# Patient Record
Sex: Male | Born: 2000 | Race: White | Hispanic: No | Marital: Single | Smoking: Never smoker
Health system: Southern US, Community
[De-identification: ages and names within clinical notes are randomized; demographics above are authoritative.]

## PROBLEM LIST (undated history)

## (undated) DIAGNOSIS — F329 Major depressive disorder, single episode, unspecified: Secondary | ICD-10-CM

## (undated) DIAGNOSIS — F32A Depression, unspecified: Secondary | ICD-10-CM

## (undated) DIAGNOSIS — F419 Anxiety disorder, unspecified: Secondary | ICD-10-CM

## (undated) DIAGNOSIS — F938 Other childhood emotional disorders: Secondary | ICD-10-CM

## (undated) DIAGNOSIS — R51 Headache: Secondary | ICD-10-CM

## (undated) DIAGNOSIS — F339 Major depressive disorder, recurrent, unspecified: Secondary | ICD-10-CM

## (undated) DIAGNOSIS — R519 Headache, unspecified: Secondary | ICD-10-CM

## (undated) HISTORY — PX: MOUTH SURGERY: SHX715

---
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
## 2018-01-21 ENCOUNTER — Telehealth: Payer: Self-pay | Admitting: Neurology

## 2018-01-21 NOTE — Telephone Encounter (Signed)
Pts mother call scheduling a f/u per PCP. Stating PCP thinks the pt is experiencing absent seizures. Scheduled an appt for August pts mother advised that RN will call to get more detail also to make sure appt is something Dr. Frances Furbish feels she can handle. Dale Ferndale would also like a sooner appt if possible, please call to advise

## 2018-01-21 NOTE — Telephone Encounter (Signed)
I called pt's mother back, no answer, left a message asking her to call me back.  I called the home number listed, spoke to the pt. He reports that his mother called today to make him an appt with Dr. Anne Hahn for seizure-like activity. He is agreeable to an appt on 01/24/18 at 7:30am, check in 7:00am with Dr. Anne Hahn. Pt reports that he will pass this information on to his mother. Pt verbalized understanding of this appt date and time.

## 2018-01-24 ENCOUNTER — Encounter: Payer: Self-pay | Admitting: Neurology

## 2018-01-24 ENCOUNTER — Telehealth: Payer: Self-pay

## 2018-01-24 ENCOUNTER — Ambulatory Visit: Payer: Medicaid Other | Admitting: Neurology

## 2018-01-24 VITALS — BP 134/96 | HR 86 | Ht 69.0 in | Wt 200.0 lb

## 2018-01-24 DIAGNOSIS — G4733 Obstructive sleep apnea (adult) (pediatric): Secondary | ICD-10-CM

## 2018-01-24 NOTE — Telephone Encounter (Signed)
I called pt. He is agreeable to completing a cpap titration study. I explained that our sleep lab will work with his insurance to get this study approved and then call pt to schedule the study. Pt verbalized understanding.

## 2018-01-24 NOTE — Progress Notes (Signed)
Reason for visit: Hypersomnolence  Thomas Acevedo is an 17 y.o. male  History of present illness:  Thomas Acevedo is an 17 year old right-handed white male with a history of depression.  The patient is now followed through psychiatry in [place], [place].  The patient is on antidepressant medications and he takes Lamictal.  The patient is now cycling more frequently with periods of hypersomnolence, this is occurring every other week.  The patient once again denies hallucinations, hypersexual behavior, he sometimes feels as if he is running a fever but he does not have low body temperature.  He does not have hyperphagia, he oftentimes will eat less during these timeframes.  The patient has undergone a sleep study that revealed evidence of a moderate level of sleep apnea.  He has been seen through ENT, they did not believe that he needed a tonsillectomy.  The patient comes back to this office for further evaluation.  The episodes of hypersomnolence are interfering with his ability to function.  Past Medical History:  Diagnosis Date  . Anxiety   . 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 Depression   . Headache   . MDD (major depressive disorder), recurrent episode (HCC) 04/10/2015    Past Surgical History:  Procedure Laterality Date  . MOUTH SURGERY      No family history on file.  Social history:  reports that he has never smoked. He has never used smokeless tobacco. He reports that he does not drink alcohol or use drugs.   No Known Allergies  Medications:  Prior to Admission medications   Not on File    ROS:  Out of a complete 14 system review of symptoms, the patient complains only of the following symptoms, and all other reviewed systems are negative.  Decreased activity, decreased appetite, fatigue Double vision, blurred vision Heat intolerance Daytime sleepiness Memory loss, dizziness, speech difficulty, weakness Confusion, depression,  anxiety  There were no vitals taken for this visit.  Physical Exam  General: The patient is alert and cooperative at the time of the examination.  Skin: No significant peripheral edema is noted.   Neurologic Exam  Mental status: The patient is alert and oriented x 3 at the time of the examination. The patient has apparent normal recent and remote memory, with an apparently normal attention span and concentration ability.   Cranial nerves: Facial symmetry is present. Speech is normal, no aphasia or dysarthria is noted. Extraocular movements are full. Visual fields are full.  Motor: The patient has good strength in all 4 extremities.  Sensory examination: Soft touch sensation is symmetric on the face, arms, and legs.  Coordination: The patient has good finger-nose-finger and heel-to-shin bilaterally.  Gait and station: The patient has a normal gait. Tandem gait is normal. Romberg is negative. No drift is seen.  Reflexes: Deep tendon reflexes are symmetric.   MRI brain 07/19/17:  IMPRESSION: This is a normal MRI of the brain with and without contrast. 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 If the patient also has snoring, consider obstructive sleep apnea as a source of the fatigue and headache.  * MRI scan images were reviewed online. I agree with the written report.    Assessment/Plan:  1.  Cyclical hypersomnolence  2.  Depression  3.  Sleep apnea, untreated  The patient has had an EEG study that was normal, he has had MRI of the brain that was normal.  He does not fulfill the criteria for Kleine-Levin  syndrome.  The patient at this point needs the CPAP trial to determine what settings are appropriate for him.  I suspect that the underlying depression remains an issue.  The family is concerned that he is having absent seizures, but I do not think that this is the case.  He will follow-up here in 6 months.  [name]. Keith Theia Dezeeuw MD 01/24/2018 7:30 AM  [facility] [address] [place], [place] [id number]  Phone [phone] Fax [phone]

## 2018-01-24 NOTE — Telephone Encounter (Signed)
-----   Message from Huston FoleySaima Athar, MD sent at 01/24/2018  8:13 AM EDT ----- Baxter HireKristen,  Please see below. Can you call him and see if wants to schedule sleep study with CPAP?  sa ----- Message ----- From: York SpanielWillis, Charles K, MD Sent: 01/24/2018   7:59 AM To: Huston FoleySaima Athar, MD  This patient was seen today, he has been seen by ENT and they did not believe that he needed a tonsillectomy, by your study it appears that he has moderate sleep apnea, I think that they are ready to pursue a CPAP trial.

## 2018-01-27 ENCOUNTER — Ambulatory Visit (INDEPENDENT_AMBULATORY_CARE_PROVIDER_SITE_OTHER): Payer: Medicaid Other | Admitting: Neurology

## 2018-01-27 DIAGNOSIS — G4710 Hypersomnia, unspecified: Secondary | ICD-10-CM

## 2018-01-27 DIAGNOSIS — G4733 Obstructive sleep apnea (adult) (pediatric): Secondary | ICD-10-CM | POA: Diagnosis not present

## 2018-01-27 DIAGNOSIS — G4730 Sleep apnea, unspecified: Secondary | ICD-10-CM

## 2018-01-28 NOTE — Procedures (Signed)
PATIENT'S NAME:  Thomas Acevedo, Thomas Acevedo DOB:      04/16/2001      MR#:    [id number]     DATE OF RECORDING: 01/27/2018 REFERRING M.D.:  Dr. Lesia SagoKeith Willis Study Performed:   CPAP  Titration HISTORY:  17 year-old male with a history of anxiety, depression, recurrent headaches, and overweight state, who returns for a CPAP titration study. His baseline PSG on 09/29/17 showed moderate obstructive sleep apnea, with a total AHI of 20.3/hour, REM AHI of 3.7/hour, supine AHI of 14.8/hour and O2 nadir of 87%. The patient endorsed the Epworth Sleepiness Scale at 10/24 points, BMI of 28.7 kg/m2. The patient's neck circumference measured 16 inches.  CURRENT MEDICATIONS: None listed.  PROCEDURE:  This is a multichannel digital polysomnogram utilizing the SomnoStar 11.2 system.  Electrodes and sensors were applied and monitored per AASM Specifications.   EEG, EOG, Chin and Limb EMG, were sampled at 200 Hz.  ECG, Snore and Nasal Pressure, Thermal Airflow, Respiratory Effort, CPAP Flow and Pressure, Oximetry was sampled at 50 Hz. Digital video and audio were recorded.      The patient was fitted with small P30i nasal pillows. CPAP was initiated at 5 cmH20 with heated humidity per AASM split night standards and pressure was advanced to 12 cmH20 because of hypopneas, apneas and desaturations.  At a PAP pressure of 11 cmH20, there was a reduction of the AHI to 0 with supine REM sleep achieved and O2 nadir of 93%.  Lights Out was at 22:50 and Lights On at 05:17. Total recording time (TRT) was 387 minutes, with a total sleep time (TST) of 337 minutes. The patient's sleep latency was 41.5 minutes, which is delayed. REM latency was 83 minutes, which is normal. The sleep efficiency was 87.1 %.    SLEEP ARCHITECTURE: WASO (Wake after sleep onset) was 12.5 minutes.  There were 9 minutes in Stage N1, 154.5 minutes Stage N2, 122.5 minutes Stage N3 and 51 minutes in Stage REM.  The percentage of Stage N1 was 2.7%, Stage N2 was 45.8%, which is  normal, Stage N3 was 36.4%, which is increased, and Stage R (REM sleep) was 15.1%, which is mildly reduced. The arousals were noted as: 30 were spontaneous, 0 were associated with PLMs, 5 were associated with respiratory events.  RESPIRATORY ANALYSIS:  There was a total of 27 respiratory events: 0 obstructive apneas, 16 central apneas and 0 mixed apneas with a total of 16 apneas and an apnea index (AI) of 2.8 /hour. There were 11 hypopneas with a hypopnea index of 2./hour. The patient also had 0 respiratory event related arousals (RERAs).      The total APNEA/HYPOPNEA INDEX  (AHI) was 4.8/hour and the total RESPIRATORY DISTURBANCE INDEX was 4.8 0./hour  7 events occurred in REM sleep and 20 events in NREM. The REM AHI was 8.2 /hour versus a non-REM AHI of 4.2 0./hour.  The patient spent 219 minutes of total sleep time in the supine position and 118 minutes in non-supine. The supine AHI was 4.7, versus a non-supine AHI of 5.1.  OXYGEN SATURATION & C02:  The baseline 02 saturation was 96%, with the lowest being 89%. Time spent below 89% saturation equaled 0 minutes.  PERIODIC LIMB MOVEMENTS: The patient had a total of 0 Periodic Limb Movements. The Periodic Limb Movement (PLM) index was 0 and the PLM Arousal index was 0 /hour.  Audio and video analysis did not show any abnormal or unusual movements, behaviors, phonations or vocalizations. The patient  took no bathroom breaks. The EKG was in keeping with normal sinus rhythm (NSR).  Post-study, the patient indicated that sleep was the same as usual.   IMPRESSION:   1. Obstructive Sleep Apnea (OSA)   RECOMMENDATIONS:   1. This study demonstrates resolution of the patient's obstructive sleep apnea with CPAP therapy. I will, therefore, start the patient on home CPAP treatment at a pressure of 11 cm via small nasal pillows with heated humidity. The patient should be reminded to be fully compliant with PAP therapy to improve sleep related symptoms and  decrease long term cardiovascular risks. The patient should be reminded, that it may take up to 3 months to get fully used to using PAP with all planned sleep. The earlier full compliance is achieved, the better long term compliance tends to be. Please note that untreated obstructive sleep apnea may carry additional perioperative morbidity. Patients with significant obstructive sleep apnea should receive perioperative PAP therapy and the surgeons and particularly the anesthesiologist should be informed of the diagnosis and the severity of the sleep disordered breathing. 2. The patient should be cautioned not to drive, work at heights, or operate dangerous or heavy equipment when tired or sleepy. Review and reiteration of good sleep hygiene measures should be pursued with any patient. 3. The patient will be seen in follow-up by Dr. Frances Furbish at [facility] for discussion of the test results and further management strategies. The referring provider will be notified of the test results.   I certify that I have reviewed the entire raw data recording prior to the issuance of this report in accordance with the Standards of Accreditation of the American Academy of Sleep Medicine (AASM)     Huston Foley, MD, PhD Diplomat, American Board of Psychiatry and Neurology (Neurology and Sleep Medicine)

## 2018-01-28 NOTE — Addendum Note (Signed)
Addended by: Huston FoleyATHAR, Orvil Faraone on: 01/28/2018 11:15 AM   Modules accepted: Orders

## 2018-01-28 NOTE — Progress Notes (Signed)
Patient referred by Dr. Anne HahnWillis, seen by me on 08/11/17, diagnostic PSG on 09/29/17 (was supposed to have MSLT next day, but this was canceled d/t evidence of OSA). Patient had a CPAP titration study on 01/27/18.  Please call and inform patient that I have entered an order for treatment with positive airway pressure (PAP) treatment for obstructive sleep apnea (OSA). He did well during the latest sleep study with CPAP. We will, therefore, arrange for a machine for home use through a DME (durable medical equipment) company of His choice; and I will see the patient back in follow-up in about 10 weeks. Please also explain to the patient that I will be looking out for compliance data, which can be downloaded from the machine (stored on an SD card, that is inserted in the machine) or via remote access through a modem, that is built into the machine. At the time of the followup appointment we will discuss sleep study results and how it is going with PAP treatment at home. Please advise patient to bring His machine at the time of the first FU visit, even though this is cumbersome. Bringing the machine for every visit after that will likely not be needed, but often helps for the first visit to troubleshoot if needed. Please re-enforce the importance of compliance with treatment and the need for us to monitor compliance data - often an insurance requirement and actually good feedback for the patient as far as how they are doing.  Also remind patient, that any interim PAP machine or mask issues should be first addressed with the DME company, as they can often help better with technical and mask fit issues. Please ask if patient has a preference regarding DME company.  Please also make sure, the patient has a follow-up appointment with me in about 10 weeks from the setup date, thanks. May see one of our nurse practitioners if needed for proper timing of the FU appointment.  Please fax or rout report to the referring provider.  Thanks,   Huston FoleySaima Arihaan Bellucci, MD, PhD [facility]

## 2018-02-01 ENCOUNTER — Telehealth: Payer: Self-pay

## 2018-02-01 NOTE — Telephone Encounter (Signed)
I called pt. I advised pt that Dr. Frances FurbishAthar reviewed their sleep study results and found that pt did well with the cpap during his latest sleep study. Dr. Frances FurbishAthar recommends that pt start a cpap at home. I reviewed PAP compliance expectations with the pt. Pt is agreeable to starting a CPAP. I advised pt that an order will be sent to a DME, [employer], and [employer] will call the pt within about one week after they file with the pt's insurance. [employer] will show the pt how to use the machine, fit for masks, and troubleshoot the CPAP if needed. A follow up appt was made for insurance purposes with Dr. Frances FurbishAthar on 05/19/18 at 3:00pm. Pt verbalized understanding to arrive 15 minutes early and bring their CPAP. A letter with all of this information in it will be mailed to the pt as a reminder. I verified with the pt that the address we have on file is correct. Pt verbalized understanding of results. Pt had no questions at this time but was encouraged to call back if questions arise.

## 2018-02-01 NOTE — Telephone Encounter (Signed)
-----   Message from Thomas FoleySaima Athar, MD sent at 01/28/2018 11:14 AM EDT ----- Patient referred by Dr. Anne HahnWillis, seen by me on 08/11/17, diagnostic PSG on 09/29/17 (was supposed to have MSLT next day, but this was canceled d/t evidence of OSA). Patient had a CPAP titration study on 01/27/18.  Please call and inform patient that I have entered an order for treatment with positive airway pressure (PAP) treatment for obstructive sleep apnea (OSA). He did well during the latest sleep study with CPAP. We will, therefore, arrange for a machine for home use through a DME (durable medical equipment) company of His choice; and I will see the patient back in follow-up in about 10 weeks. Please also explain to the patient that I will be looking out for compliance data, which can be downloaded from the machine (stored on an SD card, that is inserted in the machine) or via remote access through a modem, that is built into the machine. At the time of the followup appointment we will discuss sleep study results and how it is going with PAP treatment at home. Please advise patient to bring His machine at the time of the first FU visit, even though this is cumbersome. Bringing the machine for every visit after that will likely not be needed, but often helps for the first visit to troubleshoot if needed. Please re-enforce the importance of compliance with treatment and the need for us to monitor compliance data - often an insurance requirement and actually good feedback for the patient as far as how they are doing.  Also remind patient, that any interim PAP machine or mask issues should be first addressed with the DME company, as they can often help better with technical and mask fit issues. Please ask if patient has a preference regarding DME company.  Please also make sure, the patient has a follow-up appointment with me in about 10 weeks from the setup date, thanks. May see one of our nurse practitioners if needed for proper timing of the  FU appointment.  Please fax or rout report to the referring provider. Thanks,   Thomas FoleySaima Athar, MD, PhD [facility])

## 2018-03-31 ENCOUNTER — Ambulatory Visit: Payer: Self-pay | Admitting: Neurology

## 2018-05-17 ENCOUNTER — Encounter: Payer: Self-pay | Admitting: Neurology

## 2018-05-19 ENCOUNTER — Encounter: Payer: Self-pay | Admitting: Neurology

## 2018-05-19 ENCOUNTER — Ambulatory Visit: Payer: Medicaid Other | Admitting: Neurology

## 2018-05-19 VITALS — BP 145/86 | HR 92 | Ht 70.0 in | Wt 209.0 lb

## 2018-05-19 DIAGNOSIS — G4733 Obstructive sleep apnea (adult) (pediatric): Secondary | ICD-10-CM | POA: Diagnosis not present

## 2018-05-19 DIAGNOSIS — Z9989 Dependence on other enabling machines and devices: Secondary | ICD-10-CM | POA: Diagnosis not present

## 2018-05-19 NOTE — Patient Instructions (Addendum)
Please continue using your CPAP regularly. While your insurance requires that you use CPAP at least 4 hours each night on 70% of the nights, I recommend, that you not skip any nights and use it throughout the night if you can. Getting used to CPAP and staying with the treatment long term does take time and patience and discipline. Untreated obstructive sleep apnea when it is moderate to severe can have an adverse impact on cardiovascular health and raise her risk for heart disease, arrhythmias, hypertension, congestive heart failure, stroke and diabetes. Untreated obstructive sleep apnea causes sleep disruption, nonrestorative sleep, and sleep deprivation. This can have an impact on your day to day functioning and cause daytime sleepiness and impairment of cognitive function, memory loss, mood disturbance, and problems focussing. Using CPAP regularly can improve these symptoms.   I would suggest a routine follow up in 6 months for sleep apnea.  We would be happy to see you here if needed. Please let us know, if we need to send records to a new doctor in [place].

## 2018-05-19 NOTE — Progress Notes (Signed)
Subjective:    Patient ID: Thomas Acevedo is a 17 y.o. male.  HPI     Interim history:  Thomas Acevedo is an 17 yo right-handed gentleman with an underlying medical history of anxiety, depression, recurrent headaches, prior psychiatric admission 2016 for suicidal ideations, and overweight state, who presents for follow-up consultation of his obstructive sleep apnea, after recent sleep testing and starting CPAP therapy at home. The patient is unaccompanied today. I first met him on 08/11/2017 at the request of Dr. Jannifer Franklin, at which time the patient reported snoring and daytime somnolence, extended sleep episodes. He was advised to proceed with sleep study testing. He had a baseline sleep study, followed by a CPAP titration study. I went over his test results with him in detail today. His baseline sleep study from 09/29/2017 showed a sleep efficiency of 89%, sleep latency delayed at 43 minutes, REM latency normal at 96.5 minutes. He had a normal percentage of slow-wave sleep and a new normal percentage of REM sleep. Total AHI was in the moderate range at 20.3 per hour, REM AHI was low, supine AHI of 14.8 per hour, average oxygen saturation 95%, nadir was 87%, he had no significant PLMS. He was advised to proceed with a sleep study for CPAP titration. He had this on 01/27/2018. Sleep efficiency was 87.1%, sleep latency 41.5 minutes and REM latency 83 minutes which is normal. He had an increased percentage of slow-wave sleep and a mildly reduced percentage of REM sleep. He was titrated on CPAP from 5 cm to 12 cm. On the pressure of 11 cm his AHI was 0 per hour with supine REM sleep achieved an O2 nadir of 93%. Based on his test results I prescribed CPAP therapy for home use a pressure of 11 cm.  Today, 05/19/2018: I reviewed his CPAP compliance data from 04/18/2018 through 05/17/2018 which is a total of 30 days, during which time he used his CPAP 22 days with percent used days greater than 4 hours at 63%, indicating  mildly suboptimal compliance with an average usage of 4 hours of 41 minutes, residual AHI 3.1 per hour, leak on the low side with the 95th percentile at 4.8 L/m on a pressure of 11 cm with EPR of 3. He has been on CPAP for the past 3 months, his best compliance was at 80% for 4 hours or more during the month of late July through late August. He reports feeling better after starting CPAP therapy. He does not have severe sleepiness or longer periods of sleepiness any longer. He has started college and moved to [place]. He is in the process of establishing care with a new primary care provider through [place], and would like to follow-up for his sleep care in [place] as well.  The patient's allergies, current medications, family history, past medical history, past social history, past surgical history and problem list were reviewed and updated as appropriate.   Previously:   08/11/2017: (He) reports intermittent episodes of excessive sleepiness and extended sleep. Since July of this year these seem to happen for about a week at a time, every month. Last episode happened last week. He can sleep up to 16 hours at a time but does wake up to eat in between. He does drink some water in between. He does not have any hyperphagia before or after these episodes and has no evidence of behavioral changes before after, in particular, no hypersexuality. He denies cataplexy or sleep paralysis. He denies hypnagogic or hypnopompic hallucinations.  He is supposed to establish care with a new psychiatrist as I understand. I reviewed your office note from 07/06/2017. Patient since then had a brain MRI which was benign and an EEG, which was reported as normal in the awake state.  He has been followed by psychiatry. Of note, he has been tried on several different antidepressants. He is currently not on any prescription medication. He reports a several month history of intermittent hypersomnolence with excessive sleep. This is  accompanied by episodes of confusion, difficulty focusing on forgetfulness, even slurring of speech. Symptoms can last for about a week at a time every month. He is single and lives with his mother. He works at [employer] and is in high school. He is a nonsmoker and does not drink alcohol, denies illicit drug use. He does not use caffeine. His Epworth sleepiness score is 10 out of 24 today, fatigue score is 56 out of 63. He has no family history of narcolepsy or OSA. He has been on Lexapro in the past and before then he was on Zoloft. He was briefly on Wellbutrin in the recent. He denies recent SI. He feels more depressed and anxious during these episodes of sleepiness. He has to force himself to get out of bed.  On a normal night he may be in bed depending on his work schedule between 10 and 11 or 1:59 AM. His wake time is around 8.  His Past Medical History Is Significant For: Past Medical History:  Diagnosis Date  . Anxiety   . 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 Depression   . Headache   . MDD (major depressive disorder), recurrent episode ([place]) 04/10/2015    His Past Surgical History Is Significant For: Past Surgical History:  Procedure Laterality Date  . MOUTH SURGERY      His Family History Is Significant For: No family history on file.  His Social History Is Significant For: Social History   Socioeconomic History  . Marital status: Single    Spouse name: Not on file  . Number of children: Not on file  . Years of education: Not on file  . Highest education level: Not on file  Occupational History  . Occupation: Acupuncturist  Social Needs  . Financial resource strain: Not on file  . Food insecurity:    Worry: Not on file    Inability: Not on file  . Transportation needs:    Medical: Not on file    Non-medical: Not on file  Tobacco Use  . Smoking status: Never Smoker  . Smokeless tobacco: Never Used  Substance and Sexual Activity  .  Alcohol use: No  . Drug use: No  . Sexual activity: Not Currently  Lifestyle  . Physical activity:    Days per week: Not on file    Minutes per session: Not on file  . Stress: Not on file  Relationships  . Social connections:    Talks on phone: Not on file    Gets together: Not on file    Attends religious service: Not on file    Active member of club or organization: Not on file    Attends meetings of clubs or organizations: Not on file    Relationship status: Not on file  Other Topics Concern  . Not on file  Social History Narrative   Lives with mother   Caffeine use: rare   Right handed     His Allergies Are:  No Known Allergies:   His Current Medications Are:  Outpatient Encounter Medications as of 05/19/2018  Medication Sig  . lamoTRIgine (LAMICTAL) 100 MG tablet Take 100 mg by mouth daily.  Marland Kitchen levothyroxine (SYNTHROID, LEVOTHROID) 25 MCG tablet Take 25 mcg by mouth daily.  Marland Kitchen venlafaxine XR (EFFEXOR-XR) 75 MG 24 hr capsule Take 75 mg by mouth daily.   No facility-administered encounter medications on file as of 05/19/2018.   :  Review of Systems:  Out of a complete 14 point review of systems, all are reviewed and negative with the exception of these symptoms as listed below: Review of Systems  Neurological:       Pt presents today to discuss his cpap. Pt reports that he has not had any "spells" since starting cpap.    Objective:  Neurological Exam  Physical Exam Physical Examination:   Vitals:   05/19/18 1459  BP: (!) 145/86  Pulse: 92    General Examination: The patient is a very pleasant 17 y.o. male in no acute distress. He appears well-developed and well-nourished and well groomed.   HEENT: Normocephalic, atraumatic, pupils are equal, round and reactive to light and accommodation. He wears corrective eyeglasses. Extraocular tracking is good without limitation to gaze excursion or nystagmus noted. Normal smooth pursuit is noted. Hearing is grossly  intact. Face is symmetric with normal facial animation and normal facial sensation. Speech is clear with no dysarthria noted. There is no hypophonia. There is no lip, neck/head, jaw or voice tremor. Neck is supple with full range of passive and active motion. There are no carotid bruits on auscultation. Oropharynx exam reveals: mild mouth dryness, adequate dental hygiene and moderate airway crowding, Mild pharyngeal erythema noted. Tongue protrudes centrally and palate elevates symmetrically.   Chest: Clear to auscultation without wheezing, rhonchi or crackles noted.  Heart: S1+S2+0, regular and normal without murmurs, rubs or gallops noted.   Abdomen: Soft, non-tender and non-distended.  Extremities: There is no pitting edema in the distal lower extremities bilaterally.   Skin: Warm and dry without trophic changes noted.  Musculoskeletal: exam reveals no obvious joint deformities, tenderness or joint swelling or erythema.   Neurologically:  Mental status: The patient is awake, alert and oriented in all 4 spheres. His immediate and remote memory, attention, language skills and fund of knowledge are appropriate. There is no evidence of aphasia, agnosia, apraxia or anomia. Speech is clear with normal prosody and enunciation. Thought process is linear. Mood is normal and affect is normal.  Cranial nerves II - XII are as described above under HEENT exam.  Motor exam: Normal bulk, strength and tone is noted. There is no tremor. Fine motor skills and coordination: intact with normal finger taps, normal hand movements, normal rapid alternating patting, normal foot taps and normal foot agility.  Cerebellar testing: No dysmetria or intention tremor. There is no truncal or gait ataxia.  Sensory exam: intact to light touch in the upper and lower extremities.  Gait, station and balance: He stands easily. No veering to one side is noted. No leaning to one side is noted. Posture is age-appropriate and  stance is narrow based. Gait shows normal stride length and normal pace. No problems turning are noted.   Assessment and Plan:  In summary, Thomas Acevedo is a very pleasant 17 year old male with an underlying medical history of anxiety, depression, recurrent headaches, (hx of psychiatric admission in 2016 for SI), and borderline obesity, who presents for follow-up consultation of his obstructive sleep apnea,  after sleep testing and starting CPAP therapy. His baseline sleep study from February 2019 confirmed obstructive sleep apnea, therefore we did not proceed with a next a nap study for evaluation of hypersomnolence. He had a CPAP titration study in June 2019 and has since then established treatment at home with CPAP of 11 cm via nasal pillows. He is generally compliant with treatment and has had a few skipped nights recently in the past 30 days but has overall increased his nightly usage in terms of average time. He is commended for his treatment adherence. He reports improvement in his daytime somnolence. He has moved to [place] and would like to establish care with a sleep specialist there locally which makes sense. He is in the process of establishing care with a primary care provider as well. We would be happy to see him here in 6 months routinely if needed, otherwise we can always send records to his new sleep specialist. He is advised to call if he needs records to be transferred. He is advised to be fully compliant with CPAP therapy and in particular to not skip any nights. I answered all his questions today and he was in agreement. I spent 25 minutes in total face-to-face time with the patient, more than 50% of which was spent in counseling and coordination of care, reviewing test results, reviewing medication and discussing or reviewing the diagnosis of OSA, its prognosis and treatment options. Pertinent laboratory and imaging test results that were available during this visit with the patient were  reviewed by me and considered in my medical decision making (see chart for details).

## 2018-08-12 ENCOUNTER — Ambulatory Visit: Payer: Self-pay | Admitting: Neurology

## 2018-09-13 ENCOUNTER — Telehealth: Payer: Self-pay | Admitting: Neurology

## 2018-09-13 NOTE — Telephone Encounter (Signed)
Pts mother (on [place]) states he is not using his CPAP machine and that he would like to know where he can turn it in to. Please advise.

## 2018-09-13 NOTE — Telephone Encounter (Signed)
I called pt's mother, Thomas Acevedo, per DPR. She reports that pt is not using his cpap because he doesn't sleep well with it and they do not want to keep getting billed for it if the pt is not going to use it. I encouraged pt's mother that pt should continue using his cpap to treat his moderate osa. [name] reports that she cannot force him to use it. I offered pt an appt with Dr. Frances Furbish to discuss alternative treatments, and pt's mother accepted an appt on 10/25/18 at 9:30am. However, pt's mother reports that they plan on returning the cpap to [facility] in the meantime.

## 2018-10-25 ENCOUNTER — Telehealth: Payer: Self-pay

## 2018-10-25 ENCOUNTER — Ambulatory Visit: Payer: Self-pay | Admitting: Neurology

## 2018-10-25 NOTE — Telephone Encounter (Signed)
Pt did not show for their appt with Dr. Athar today.  

## 2018-10-26 ENCOUNTER — Encounter: Payer: Self-pay | Admitting: Neurology

## 2019-03-08 ENCOUNTER — Other Ambulatory Visit: Payer: Self-pay | Admitting: *Deleted

## 2019-03-08 DIAGNOSIS — Z20822 Contact with and (suspected) exposure to covid-19: Secondary | ICD-10-CM

## 2019-03-11 LAB — NOVEL CORONAVIRUS, NAA: SARS-CoV-2, NAA: NOT DETECTED
